# Patient Record
Sex: Female | Born: 1990 | Race: Black or African American | Hispanic: No | Marital: Single | Smoking: Never smoker
Health system: Southern US, Community
[De-identification: ages and names within clinical notes are randomized; demographics above are authoritative.]

---

## 2016-06-15 ENCOUNTER — Emergency Department (INDEPENDENT_AMBULATORY_CARE_PROVIDER_SITE_OTHER)
Admission: EM | Admit: 2016-06-15 | Discharge: 2016-06-15 | Disposition: A | Discharge status: Home / Self Care | Payer: Worker's Compensation | Source: Home / Self Care | Attending: Family Medicine | Admitting: Family Medicine

## 2016-06-15 ENCOUNTER — Encounter: Payer: Self-pay | Admitting: *Deleted

## 2016-06-15 ENCOUNTER — Emergency Department (INDEPENDENT_AMBULATORY_CARE_PROVIDER_SITE_OTHER): Payer: Worker's Compensation

## 2016-06-15 DIAGNOSIS — M25532 Pain in left wrist: Secondary | ICD-10-CM | POA: Diagnosis not present

## 2016-06-15 DIAGNOSIS — M79642 Pain in left hand: Secondary | ICD-10-CM | POA: Diagnosis not present

## 2016-06-15 DIAGNOSIS — S6392XA Sprain of unspecified part of left wrist and hand, initial encounter: Secondary | ICD-10-CM

## 2016-06-15 DIAGNOSIS — S63502A Unspecified sprain of left wrist, initial encounter: Secondary | ICD-10-CM

## 2016-06-15 MED ORDER — IBUPROFEN 200 MG PO TABS
200.0000 mg | ORAL_TABLET | Freq: Once | ORAL | Status: AC
  Administered 2016-06-15: 200 mg via ORAL
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
## 2016-06-15 NOTE — Discharge Instructions (Signed)
°  You may use a cool compress 2-3 times a day for 15-20 minutes at a time.  As pain eases off, you may take your hand out of the splint and do gentle movements with hand and wrist to help prevent it from getting stiff.  You may alternate acetaminophen and ibuprofen for pain or you may take these medications at the same time. Up to 500mg  acetaminophen and 600mg  ibuprofen.
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
## 2016-06-28 ENCOUNTER — Telehealth: Payer: Self-pay | Admitting: *Deleted

## 2016-06-28 NOTE — Telephone Encounter (Signed)
Pt called and left message requesting a work release note. Called back left her a message that she would need to schedule an appt with [facility] if she has not already for f/u to be released.
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
## 2021-01-06 ENCOUNTER — Encounter (HOSPITAL_COMMUNITY): Payer: Self-pay

## 2021-01-06 ENCOUNTER — Other Ambulatory Visit: Payer: Self-pay

## 2021-01-06 ENCOUNTER — Emergency Department (HOSPITAL_COMMUNITY)
Admission: EM | Admit: 2021-01-06 | Discharge: 2021-01-06 | Disposition: A | Discharge status: Home / Self Care | Payer: Self-pay | Attending: Emergency Medicine | Admitting: Emergency Medicine

## 2021-01-06 DIAGNOSIS — K649 Unspecified hemorrhoids: Secondary | ICD-10-CM

## 2021-01-06 DIAGNOSIS — K648 Other hemorrhoids: Secondary | ICD-10-CM | POA: Insufficient documentation

## 2021-01-06 DIAGNOSIS — K625 Hemorrhage of anus and rectum: Secondary | ICD-10-CM

## 2021-01-06 LAB — CBC WITH DIFFERENTIAL/PLATELET
Abs Immature Granulocytes: 0.03 10*3/uL (ref 0.00–0.07)
Basophils Absolute: 0.1 10*3/uL (ref 0.0–0.1)
Basophils Relative: 1 %
Eosinophils Absolute: 0.1 10*3/uL (ref 0.0–0.5)
Eosinophils Relative: 1 %
HCT: 41.6 % (ref 36.0–46.0)
Hemoglobin: 13.1 g/dL (ref 12.0–15.0)
Immature Granulocytes: 0 %
Lymphocytes Relative: 43 %
Lymphs Abs: 4.2 10*3/uL — ABNORMAL HIGH (ref 0.7–4.0)
MCH: 26.7 pg (ref 26.0–34.0)
MCHC: 31.5 g/dL (ref 30.0–36.0)
MCV: 84.9 fL (ref 80.0–100.0)
Monocytes Absolute: 0.5 10*3/uL (ref 0.1–1.0)
Monocytes Relative: 5 %
Neutro Abs: 5 10*3/uL (ref 1.7–7.7)
Neutrophils Relative %: 50 %
Platelets: 303 10*3/uL (ref 150–400)
RBC: 4.9 MIL/uL (ref 3.87–5.11)
RDW: 14.7 % (ref 11.5–15.5)
WBC: 9.8 10*3/uL (ref 4.0–10.5)
nRBC: 0 % (ref 0.0–0.2)

## 2021-01-06 LAB — POC OCCULT BLOOD, ED: Fecal Occult Bld: NEGATIVE

## 2021-01-06 MED ORDER — HYDROCORTISONE ACETATE 25 MG RE SUPP: 25.0000 mg | Freq: Two times a day (BID) | RECTAL | 0 refills | Status: DC

## 2021-01-06 MED ORDER — DOCUSATE SODIUM 100 MG PO CAPS: 100.0000 mg | ORAL_CAPSULE | Freq: Two times a day (BID) | ORAL | 0 refills | Status: AC

## 2021-01-06 MED ORDER — HYDROCORTISONE ACETATE 25 MG RE SUPP: 25.0000 mg | Freq: Two times a day (BID) | RECTAL | 0 refills | Status: AC

## 2021-01-06 NOTE — ED Provider Notes (Signed)
[facility] EMERGENCY DEPARTMENT Provider Note   CSN: [id number] Arrival date & time: 01/06/21  1129     History Chief Complaint  Patient presents with   Rectal Bleeding    Michelle Brady is a 30 y.o. female.  Patient reports intermittent bright red rectal bleeding for one month. Episodes primarily associated with bowel movements. She endorses hard, formed stools. Rectal itching and burning.  The history is provided by the patient. No language interpreter was used.  Rectal Bleeding Quality:  Bright red Amount:  Scant Duration:  1 month Timing:  Intermittent Chronicity:  Recurrent Context: constipation and defecation   Worsened by:  Defecation and wiping Ineffective treatments:  Hemorrhoid cream Associated symptoms: no dizziness, no fever and no light-headedness   Risk factors: no anticoagulant use and no hx of IBD       History reviewed. No pertinent past medical history.  There are no problems to display for this patient.   Past Surgical History:  Procedure Laterality Date   CESAREAN SECTION       OB History   No obstetric history on file.     History reviewed. No pertinent family history.  Social History   Tobacco Use   Smoking status: Never   Smokeless tobacco: Never  Substance Use Topics   Alcohol use: No   Drug use: No    Home Medications Prior to Admission medications   Not on File    Allergies    Patient has no known allergies.  Review of Systems   Review of Systems  Constitutional:  Negative for fever.  Gastrointestinal:  Positive for anal bleeding, constipation and hematochezia.  Neurological:  Negative for dizziness and light-headedness.  All other systems reviewed and are negative.  Physical Exam Updated Vital Signs BP (!) 134/91 (BP Location: Right Arm)   Pulse 92   Temp 98.4 F (36.9 C) (Oral)   Resp 18   Ht 5\' 3"  (1.6 m)   Wt 85.7 kg   LMP 09/16/2020 (Approximate) Comment: Pt. on Depo  SpO2 100%   BMI 33.48  kg/m   Physical Exam Vitals and nursing note reviewed.  Constitutional:      Appearance: Normal appearance.  HENT:     Head: Normocephalic.     Nose: Nose normal.  Eyes:     Conjunctiva/sclera: Conjunctivae normal.  Cardiovascular:     Rate and Rhythm: Normal rate.  Pulmonary:     Effort: Pulmonary effort is normal.  Abdominal:     Palpations: Abdomen is soft.  Genitourinary:    Rectum: Guaiac result negative. Internal hemorrhoid present. No anal fissure.  Skin:    General: Skin is warm.  Neurological:     Mental Status: She is alert and oriented to person, place, and time.  Psychiatric:        Mood and Affect: Mood normal.        Behavior: Behavior normal.    ED Results / Procedures / Treatments   Labs (all labs ordered are listed, but only abnormal results are displayed) Labs Reviewed  CBC WITH DIFFERENTIAL/PLATELET - Abnormal; Notable for the following components:      Result Value   Lymphs Abs 4.2 (*)    All other components within normal limits  POC OCCULT BLOOD, ED    EKG None  Radiology No results found.  Procedures Procedures   Medications Ordered in ED Medications - No data to display  ED Course  I have reviewed the triage vital signs and the nursing  notes.  Pertinent labs & imaging results that were available during my care of the patient were reviewed by me and considered in my medical decision making (see chart for details).    MDM Rules/Calculators/A&P                           Patient presents for evaluation of rectal bleeding. Differential dx include diverticulitis versus hemorrhoids. Presentation not consistent with mesenteric ischemia or colitis. No evidence of anemia. Hemoccult negative, small internal hemorrhoid noted. No imaging indicated at this time. Final Clinical Impression(s) / ED Diagnoses Final diagnoses:  Rectal bleeding  Hemorrhoids, unspecified hemorrhoid type    Rx / DC Orders ED Discharge Orders          Ordered     hydrocortisone (ANUSOL-HC) 25 MG suppository  2 times daily,   Status:  Discontinued        01/06/21 1434    docusate sodium (COLACE) 100 MG capsule  Every 12 hours        01/06/21 1434    hydrocortisone (ANUSOL-HC) 25 MG suppository  2 times daily        01/06/21 1435             Felicie Morn, NP 01/06/21 1610    Mancel Bale, MD 01/07/21 [phone]

## 2021-01-06 NOTE — Discharge Instructions (Addendum)
Please refer to the attached instructions. Take medication as directed. Follow-up with gastroenterology.

## 2021-01-06 NOTE — ED Triage Notes (Signed)
Pt. States they have rectal bleeding. Pt. States they have bright red blood when they have a bowel movement. Pt. States this has been happening for a month now.
